# Patient Record
Sex: Female | Born: 2000 | Race: White | Hispanic: No | Marital: Single | Smoking: Never smoker
Health system: Southern US, Community
[De-identification: ages and names within clinical notes are randomized; demographics above are authoritative.]

## PROBLEM LIST (undated history)

## (undated) ENCOUNTER — Emergency Department (HOSPITAL_COMMUNITY): Admission: EM | Discharge status: Against Medical Advice | Payer: 59

## (undated) DIAGNOSIS — J45998 Other asthma: Secondary | ICD-10-CM

## (undated) DIAGNOSIS — F329 Major depressive disorder, single episode, unspecified: Secondary | ICD-10-CM

## (undated) DIAGNOSIS — J302 Other seasonal allergic rhinitis: Secondary | ICD-10-CM

## (undated) DIAGNOSIS — F32A Depression, unspecified: Secondary | ICD-10-CM

## (undated) DIAGNOSIS — E559 Vitamin D deficiency, unspecified: Secondary | ICD-10-CM

## (undated) DIAGNOSIS — R630 Anorexia: Secondary | ICD-10-CM

## (undated) HISTORY — DX: Vitamin D deficiency, unspecified: E55.9

## (undated) HISTORY — DX: Other asthma: J45.998

---
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
## 2021-02-04 ENCOUNTER — Encounter: Payer: Self-pay | Admitting: Primary Care

## 2021-04-17 ENCOUNTER — Ambulatory Visit: Payer: 59 | Admitting: Family Medicine

## 2021-04-17 ENCOUNTER — Other Ambulatory Visit: Payer: Self-pay

## 2021-04-17 ENCOUNTER — Encounter: Payer: Self-pay | Admitting: Family Medicine

## 2021-04-17 VITALS — BP 102/76 | HR 72 | Temp 98.8°F | Wt 88.8 lb

## 2021-04-17 DIAGNOSIS — N912 Amenorrhea, unspecified: Secondary | ICD-10-CM | POA: Diagnosis not present

## 2021-04-17 DIAGNOSIS — R222 Localized swelling, mass and lump, trunk: Secondary | ICD-10-CM | POA: Diagnosis not present

## 2021-04-17 NOTE — Progress Notes (Signed)
Subjective:    Patient ID: Amy Lynn, female    DOB: 11-26-2000, 20 y.o.   MRN: [id number]  Chief Complaint  Patient presents with   Mass    Noticed mass on chest near right breast 1 week ago    HPI Patient was seen today for acute concern.  Pt noticed a lump in R chest inferior to R breast last wk.  The area is painless.  Pt never noticed it prior to last wk, but concerned it may have become larger.  Pt denies breast pain, skin changes.  LMP 1 yr ago.  Past Medical History:  Diagnosis Date   Anorexia    Anorexia    Depression    Seasonal allergies    Seasonal asthma    Vitamin D insufficiency 08/28/2013   Normal level 12/22/13     No Known Allergies  ROS General: Denies fever, chills, night sweats, changes in weight, changes in appetite HEENT: Denies headaches, ear pain, changes in vision, rhinorrhea, sore throat CV: Denies CP, palpitations, SOB, orthopnea Pulm: Denies SOB, cough, wheezing GI: Denies abdominal pain, nausea, vomiting, diarrhea, constipation GU: Denies dysuria, hematuria, frequency, vaginal discharge Msk: Denies muscle cramps, joint pains  +mass in R lower chest Neuro: Denies weakness, numbness, tingling Skin: Denies rashes, bruising Psych: Denies depression, anxiety, hallucinations      Objective:    Blood pressure 102/76, pulse 72, temperature 98.8 F (37.1 C), temperature source Oral, weight 88 lb 12.8 oz (40.3 kg), SpO2 99 %.  Gen. Pleasant, well-nourished, in no distress, normal affect   HEENT: [place]/AT, face symmetric, conjunctiva clear, no scleral icterus, PERRLA, EOMI, nares patent without drainage Lungs: no accessory muscle use Cardiovascular: RRR, no peripheral edema Chest wall: mobile, soft, 3.5 cm  x 2 cm mass with firma nodule in medial edge, inferior to R lateral breast. No peau d' orange or erythema Musculoskeletal: No deformities, no cyanosis or clubbing, normal tone Neuro:  A&Ox3, CN II-XII intact, normal gait Skin:  Warm, no lesions/  rash   Wt Readings from Last 3 Encounters:  04/17/21 88 lb 12.8 oz (40.3 kg) (<1 %, Z= -2.94)*  05/30/20 (!) 91 lb (41.3 kg) (<1 %, Z= -2.55)*  10/26/19 92 lb 4 oz (41.8 kg) (1 %, Z= -2.26)*   * Growth percentiles are based on CDC (Girls, 2-20 Years) data.    Lab Results  Component Value Date   WBC 6.2 10/05/2019   HGB 12.3 10/05/2019   HCT 36.8 10/05/2019   PLT 195.0 10/05/2019   GLUCOSE 82 10/05/2019   CHOL 176 (H) 04/03/2015   TRIG 56 04/03/2015   HDL 61 04/03/2015   LDLCALC 104 04/03/2015   ALT 23 03/28/2019   AST 23 03/28/2019   NA 140 10/05/2019   K 4.1 10/05/2019   CL 102 10/05/2019   CREATININE 0.51 10/05/2019   BUN 18 10/05/2019   CO2 30 10/05/2019   TSH 1.20 10/05/2019   HGBA1C 5.7 (H) 04/03/2015    Assessment/Plan:  Mass in chest  -possible lipoma - Plan: [place] CHEST SOFT TISSUE  Absent menses -likely 2/2 h/o anorexia -3 lbs wt loss since visit 05/30/20 -consider counseling  -close f/u advised  F/u prn with pcp  Grier Mitts, MD

## 2021-04-17 NOTE — Patient Instructions (Signed)
   With ultrasound and was placed to further evaluate the area in your chest/side.  You will be called about scheduling this appointment.  I have provided some information on lipomas for you to look over.

## 2021-04-23 NOTE — Telephone Encounter (Signed)
Amy Lynn,  See patient's message, she would like to have her ultrasound done at [facility] rather than [facility] imaging, but she wants it done this week.  Is there any chance to get her in [facility] this week for the breast ultrasound?  If not then she will stick with [facility] imaging as scheduled.

## 2021-04-25 ENCOUNTER — Ambulatory Visit
Admission: RE | Admit: 2021-04-25 | Discharge: 2021-04-25 | Disposition: A | Discharge status: Home / Self Care | Payer: 59 | Source: Ambulatory Visit | Attending: Family Medicine | Admitting: Family Medicine

## 2021-04-25 ENCOUNTER — Other Ambulatory Visit: Payer: Self-pay

## 2021-04-25 DIAGNOSIS — R222 Localized swelling, mass and lump, trunk: Secondary | ICD-10-CM

## 2021-04-25 DIAGNOSIS — N6310 Unspecified lump in the right breast, unspecified quadrant: Secondary | ICD-10-CM | POA: Diagnosis not present

## 2021-05-20 ENCOUNTER — Other Ambulatory Visit: Payer: Self-pay

## 2021-05-20 DIAGNOSIS — L218 Other seborrheic dermatitis: Secondary | ICD-10-CM | POA: Diagnosis not present

## 2021-05-20 DIAGNOSIS — L280 Lichen simplex chronicus: Secondary | ICD-10-CM | POA: Diagnosis not present

## 2021-05-20 DIAGNOSIS — D485 Neoplasm of uncertain behavior of skin: Secondary | ICD-10-CM | POA: Diagnosis not present

## 2021-05-20 MED ORDER — TRIAMCINOLONE ACETONIDE 0.1 % EX CREA
TOPICAL_CREAM | CUTANEOUS | 1 refills | Status: DC
  Filled 2021-05-20: qty 15, 10d supply, fill #0
  Filled 2021-06-11: qty 15, 10d supply, fill #1

## 2021-06-11 ENCOUNTER — Other Ambulatory Visit: Payer: Self-pay

## 2021-06-12 ENCOUNTER — Other Ambulatory Visit: Payer: Self-pay

## 2021-06-12 MED ORDER — TRETINOIN 0.025 % EX CREA
TOPICAL_CREAM | CUTANEOUS | 2 refills | Status: AC
  Filled 2021-06-12: qty 20, 30d supply, fill #0
  Filled 2021-10-14: qty 20, 30d supply, fill #1
  Filled 2022-03-25: qty 20, 30d supply, fill #2

## 2021-09-12 ENCOUNTER — Other Ambulatory Visit: Payer: Self-pay

## 2021-09-12 MED ORDER — AMOXICILLIN 500 MG PO CAPS
ORAL_CAPSULE | ORAL | 0 refills | Status: DC
  Filled 2021-09-12: qty 21, 7d supply, fill #0

## 2021-10-14 ENCOUNTER — Other Ambulatory Visit: Payer: Self-pay

## 2022-03-25 ENCOUNTER — Other Ambulatory Visit: Payer: Self-pay

## 2022-04-09 DIAGNOSIS — L728 Other follicular cysts of the skin and subcutaneous tissue: Secondary | ICD-10-CM | POA: Diagnosis not present

## 2022-04-09 DIAGNOSIS — L700 Acne vulgaris: Secondary | ICD-10-CM | POA: Diagnosis not present

## 2022-04-09 DIAGNOSIS — D2361 Other benign neoplasm of skin of right upper limb, including shoulder: Secondary | ICD-10-CM | POA: Diagnosis not present

## 2022-04-23 DIAGNOSIS — L538 Other specified erythematous conditions: Secondary | ICD-10-CM | POA: Diagnosis not present

## 2022-04-23 DIAGNOSIS — L728 Other follicular cysts of the skin and subcutaneous tissue: Secondary | ICD-10-CM | POA: Diagnosis not present

## 2022-04-23 DIAGNOSIS — D2272 Melanocytic nevi of left lower limb, including hip: Secondary | ICD-10-CM | POA: Diagnosis not present

## 2022-04-23 DIAGNOSIS — D2361 Other benign neoplasm of skin of right upper limb, including shoulder: Secondary | ICD-10-CM | POA: Diagnosis not present

## 2022-04-23 DIAGNOSIS — D485 Neoplasm of uncertain behavior of skin: Secondary | ICD-10-CM | POA: Diagnosis not present

## 2022-09-11 ENCOUNTER — Ambulatory Visit: Payer: 59 | Admitting: Primary Care

## 2022-09-25 ENCOUNTER — Ambulatory Visit (INDEPENDENT_AMBULATORY_CARE_PROVIDER_SITE_OTHER): Payer: 59 | Admitting: Primary Care

## 2022-09-25 ENCOUNTER — Encounter: Payer: Self-pay | Admitting: Primary Care

## 2022-09-25 VITALS — BP 108/66 | HR 88 | Temp 97.5°F | Ht 60.0 in | Wt 90.4 lb

## 2022-09-25 DIAGNOSIS — F5000 Anorexia nervosa, unspecified: Secondary | ICD-10-CM

## 2022-09-25 DIAGNOSIS — L818 Other specified disorders of pigmentation: Secondary | ICD-10-CM | POA: Insufficient documentation

## 2022-09-25 LAB — COMPREHENSIVE METABOLIC PANEL
ALT: 50 U/L — ABNORMAL HIGH (ref 0–35)
AST: 41 U/L — ABNORMAL HIGH (ref 0–37)
Albumin: 4.9 g/dL (ref 3.5–5.2)
Alkaline Phosphatase: 74 U/L (ref 39–117)
BUN: 13 mg/dL (ref 6–23)
CO2: 32 mEq/L (ref 19–32)
Calcium: 9.1 mg/dL (ref 8.4–10.5)
Chloride: 101 mEq/L (ref 96–112)
Creatinine, Ser: 0.58 mg/dL (ref 0.40–1.20)
GFR: 130.59 mL/min (ref >60.00)
Glucose, Bld: 76 mg/dL (ref 70–99)
Potassium: 3.9 mEq/L (ref 3.5–5.1)
Sodium: 141 mEq/L (ref 135–145)
Total Bilirubin: 0.3 mg/dL (ref 0.2–1.2)
Total Protein: 7.4 g/dL (ref 6.0–8.3)

## 2022-09-25 LAB — CBC
HCT: 41.5 % (ref 36.0–46.0)
Hemoglobin: 14 g/dL (ref 12.0–15.0)
MCHC: 33.6 g/dL (ref 30.0–36.0)
MCV: 90.8 fl (ref 78.0–100.0)
Platelets: 199 10*3/uL (ref 150.0–400.0)
RBC: 4.57 Mil/uL (ref 3.87–5.11)
RDW: 13.2 % (ref 11.5–14.6)
WBC: 4.9 10*3/uL (ref 4.5–10.5)

## 2022-09-25 LAB — PHOSPHORUS: Phosphorus: 3.1 mg/dL (ref 2.3–4.6)

## 2022-09-25 LAB — VITAMIN B12: Vitamin B-12: 505 pg/mL (ref 211–911)

## 2022-09-25 LAB — MAGNESIUM: Magnesium: 2 mg/dL (ref 1.5–2.5)

## 2022-09-25 LAB — VITAMIN D 25 HYDROXY (VIT D DEFICIENCY, FRACTURES): VITD: 39.19 ng/mL (ref 30.00–100.00)

## 2022-09-25 LAB — TSH: TSH: 2.37 u[IU]/mL (ref 0.35–5.50)

## 2022-09-25 NOTE — Patient Instructions (Signed)
Stop by the lab prior to leaving today. I will notify you of your results once received.   It was a pleasure to see you today!  

## 2022-09-25 NOTE — Assessment & Plan Note (Signed)
No apparent complications. Checking HIV and Hep C labs today.

## 2022-09-25 NOTE — Assessment & Plan Note (Signed)
In remission.  Commended her on her balanced diet. Long discussion today regarding goals of overall health and bone health.  Continue calcium consumption, multivitamin, and weight bearing exercise.   Checking labs today including TSH, CMP, CBC, phosphorous, magnesium, vitamin levels.

## 2022-09-25 NOTE — Progress Notes (Signed)
Subjective:    Patient ID: Amy Lynn, female    DOB: 03/12/2001, 21 y.o.   MRN: [id number]  HPI  Amy Lynn is a very pleasant 21 y.o. female with a history of anorexia nervosa, hyponatremia, obsessive behavior who presents today to discuss a few concerns.  History of anorexia nervosa, has been in remission for 2 years. She is concerned about her overall nutrition given her anorexia history, particularly her bone mass. She feels stable at her current weight, has not lost or gained weight.  She counts calories daily, is eating 1600-1700 calories daily which is an increase from prior years. She takes a multivitamin once daily. She tries to drink 3 cups of almond milk daily, also eats cottage cheese and/or yogurt. She incorporates a balance of fruits, vegetables, nuts, and protein. She is exercising at the gym 15 min 7 days weekly, cardio only.   She does not have regular menstrual cycles, typically experiences menses once annually. Her mother was the same, history of amenorrhea. She is not concerned about this today.  Wt Readings from Last 3 Encounters:  09/25/22 90 lb 6.4 oz (41 kg)  04/17/21 88 lb 12.8 oz (40.3 kg) (<1 %, Z= -2.94)*  05/30/20 (!) 91 lb (41.3 kg) (<1 %, Z= -2.55)*   * Growth percentiles are based on CDC (Girls, 2-20 Years) data.   She used [employer] for a poke and stick tattoo at the age of 48, has three tattoos in total. Is concerned about health safety given the use of the tattoo needle without a sterile field. She denies any infections to the site at the time.    Review of Systems  Respiratory:  Negative for shortness of breath.   Cardiovascular:  Negative for chest pain and palpitations.  Gastrointestinal:  Negative for constipation and diarrhea.  Genitourinary:  Positive for menstrual problem.       Amenorrhea.   Neurological:  Negative for dizziness.         Past Medical History:  Diagnosis Date   Anorexia    Anorexia    Arthralgia of right hand  03/28/2019   Depression    Seasonal allergies    Seasonal asthma    Vitamin D insufficiency 08/28/2013   Normal level 12/22/13     Social History   Socioeconomic History   Marital status: Single    Spouse name: Not on file   Number of children: Not on file   Years of education: Not on file   Highest education level: Not on file  Occupational History   Not on file  Tobacco Use   Smoking status: Never   Smokeless tobacco: Never  Substance and Sexual Activity   Alcohol use: No    Alcohol/week: 0.0 standard drinks of alcohol   Drug use: No   Sexual activity: Never    Birth control/protection: Abstinence  Other Topics Concern   Not on file  Social History Narrative   Birth history:  Born at [facility], full term, c-section, no complications.     Lives at home with mother and father, no siblings.   Enjoys baking, reading, creating art.   Social Determinants of Health   Financial Resource Strain: Not on file  Food Insecurity: Not on file  Transportation Needs: Not on file  Physical Activity: Not on file  Stress: Not on file  Social Connections: Not on file  Intimate Partner Violence: Not on file    No past surgical history on file.  Family History  Problem Relation Age of Onset   Liver disease Neg Hx     Allergies  Allergen Reactions   Latex Rash    Current Outpatient Medications on File Prior to Visit  Medication Sig Dispense Refill   Multiple Vitamin (MULTIVITAMIN) tablet Take 1 tablet by mouth daily.     No current facility-administered medications on file prior to visit.    BP 108/66   Pulse 88   Temp (!) 97.5 F (36.4 C) (Oral)   Ht 5' (1.524 m)   Wt 90 lb 6.4 oz (41 kg)   SpO2 99%   BMI 17.66 kg/m  Objective:   Physical Exam Cardiovascular:     Rate and Rhythm: Normal rate and regular rhythm.  Pulmonary:     Effort: Pulmonary effort is normal.     Breath sounds: Normal breath sounds.  Abdominal:     General: Bowel sounds  are normal.     Palpations: Abdomen is soft.     Tenderness: There is no abdominal tenderness.  Musculoskeletal:     Cervical back: Neck supple.  Skin:    General: Skin is warm and dry.           Assessment & Plan:   Problem List Items Addressed This Visit       Other   Anorexia nervosa - Primary    In remission.  Commended her on her balanced diet. Long discussion today regarding goals of overall health and bone health.  Continue calcium consumption, multivitamin, and weight bearing exercise.   Checking labs today including TSH, CMP, CBC, phosphorous, magnesium, vitamin levels.      Relevant Orders   CBC   Comprehensive metabolic panel   VITAMIN D 25 Hydroxy (Vit-D Deficiency, Fractures)   Hepatitis C antibody   HIV Antibody (routine testing w rflx)   Phosphorus   Magnesium   TSH   Vitamin B12   History of tattoo    No apparent complications. Checking HIV and Hep C labs today.          Pleas Koch, NP

## 2022-09-26 LAB — HIV ANTIBODY (ROUTINE TESTING W REFLEX): HIV 1&2 Ab, 4th Generation: NONREACTIVE

## 2022-09-26 LAB — HEPATITIS C ANTIBODY: Hepatitis C Ab: NONREACTIVE

## 2023-03-29 IMAGING — US US BREAST*R* LIMITED INC AXILLA
1 series · 2 of 2 positions shown · non-contrast
Comparison: None.

CLINICAL DATA: 19-year-old female with a palpable right breast
lump.

EXAM:
ULTRASOUND OF THE RIGHT BREAST

[Series 1: us breast*right* limited inc axilla · 0.05mm/px · 2 of 2 slices shown]
[im 1/2]
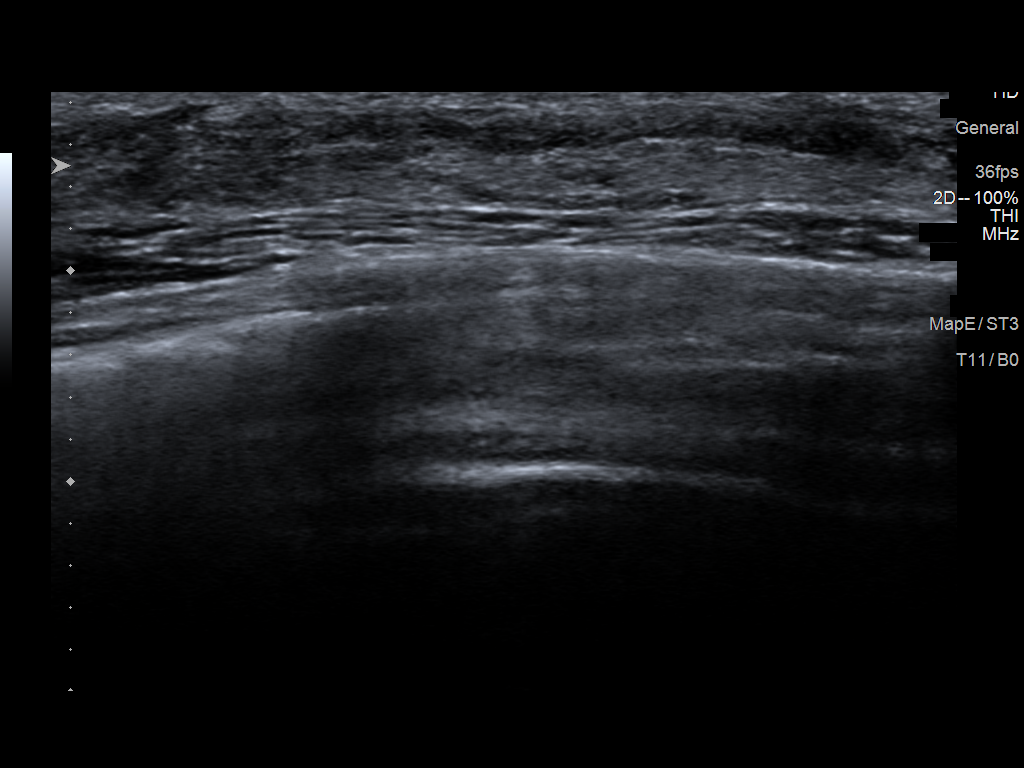
[im 2/2]
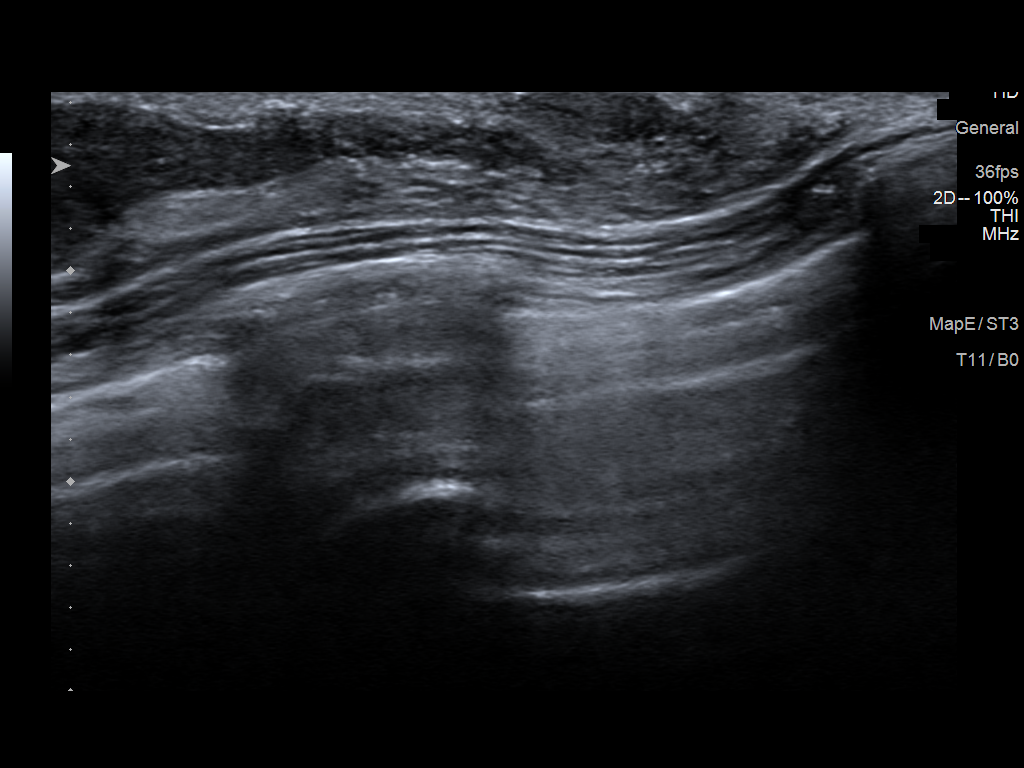

[2 of 2 positions shown; findings below may reference images not displayed]

FINDINGS: On physical exam, I palpate a soft, mobile area of thickening along
the inferior right breast.

Targeted ultrasound is performed, showing heterogenous
fibroglandular tissue without focal or suspicious sonographic
abnormality. Evaluation of the inferior right breast was performed.
IMPRESSION: Unremarkable ultrasound evaluation of the right breast.

RECOMMENDATION:
1. Clinical follow-up recommended for the palpable area of concern
in the breast. Any further workup should be based on clinical
grounds.
2. Screening mammogram at age 41 unless there are persistent or
intervening clinical concerns. (Code:[id number]-K-BES)

I have discussed the findings and recommendations with the patient.
If applicable, a reminder letter will be sent to the patient
regarding the next appointment.

BI-RADS CATEGORY  1: Negative.

## 2023-10-06 ENCOUNTER — Other Ambulatory Visit: Payer: Self-pay

## 2023-10-06 DIAGNOSIS — L700 Acne vulgaris: Secondary | ICD-10-CM | POA: Diagnosis not present

## 2023-10-06 MED ORDER — TRETINOIN 0.025 % EX GEL
1.0000 | Freq: Every evening | CUTANEOUS | 2 refills | Status: AC
  Filled 2023-10-06: qty 45, 30d supply, fill #0

## 2023-10-06 MED ORDER — DOXYCYCLINE MONOHYDRATE 50 MG PO TABS
50.0000 mg | ORAL_TABLET | Freq: Two times a day (BID) | ORAL | 3 refills | Status: AC
  Filled 2023-10-06: qty 60, 30d supply, fill #0
  Filled 2023-11-04: qty 60, 30d supply, fill #1

## 2023-10-07 ENCOUNTER — Other Ambulatory Visit: Payer: Self-pay

## 2023-10-29 DIAGNOSIS — L700 Acne vulgaris: Secondary | ICD-10-CM | POA: Diagnosis not present

## 2023-10-29 DIAGNOSIS — D2262 Melanocytic nevi of left upper limb, including shoulder: Secondary | ICD-10-CM | POA: Diagnosis not present

## 2023-11-04 ENCOUNTER — Other Ambulatory Visit: Payer: Self-pay

## 2023-12-02 ENCOUNTER — Other Ambulatory Visit: Payer: Self-pay

## 2023-12-02 ENCOUNTER — Encounter: Payer: Self-pay | Admitting: Pharmacist

## 2023-12-02 DIAGNOSIS — L308 Other specified dermatitis: Secondary | ICD-10-CM | POA: Diagnosis not present

## 2023-12-02 DIAGNOSIS — L700 Acne vulgaris: Secondary | ICD-10-CM | POA: Diagnosis not present

## 2023-12-02 MED ORDER — ISOTRETINOIN 30 MG PO CAPS
30.0000 mg | ORAL_CAPSULE | Freq: Every day | ORAL | 0 refills | Status: DC
  Filled 2023-12-02 – 2023-12-03 (×3): qty 30, 30d supply, fill #0

## 2023-12-02 MED ORDER — TRIAMCINOLONE ACETONIDE 0.1 % EX OINT
TOPICAL_OINTMENT | CUTANEOUS | 1 refills | Status: AC
  Filled 2023-12-02: qty 80, 30d supply, fill #0

## 2023-12-03 ENCOUNTER — Other Ambulatory Visit: Payer: Self-pay

## 2023-12-30 ENCOUNTER — Other Ambulatory Visit: Payer: Self-pay

## 2023-12-30 DIAGNOSIS — L700 Acne vulgaris: Secondary | ICD-10-CM | POA: Diagnosis not present

## 2023-12-30 DIAGNOSIS — Z3202 Encounter for pregnancy test, result negative: Secondary | ICD-10-CM | POA: Diagnosis not present

## 2023-12-30 MED ORDER — ISOTRETINOIN 30 MG PO CAPS
60.0000 mg | ORAL_CAPSULE | Freq: Every day | ORAL | 0 refills | Status: DC
  Filled 2023-12-30 (×2): qty 60, 30d supply, fill #0

## 2024-01-25 DIAGNOSIS — L700 Acne vulgaris: Secondary | ICD-10-CM | POA: Diagnosis not present

## 2024-02-02 ENCOUNTER — Other Ambulatory Visit: Payer: Self-pay

## 2024-02-02 DIAGNOSIS — Z3202 Encounter for pregnancy test, result negative: Secondary | ICD-10-CM | POA: Diagnosis not present

## 2024-02-02 DIAGNOSIS — L700 Acne vulgaris: Secondary | ICD-10-CM | POA: Diagnosis not present

## 2024-02-02 DIAGNOSIS — R040 Epistaxis: Secondary | ICD-10-CM | POA: Diagnosis not present

## 2024-02-02 MED ORDER — ISOTRETINOIN 30 MG PO CAPS
60.0000 mg | ORAL_CAPSULE | Freq: Every day | ORAL | 0 refills | Status: AC
  Filled 2024-02-02 (×2): qty 60, 30d supply, fill #0

## 2024-03-02 ENCOUNTER — Other Ambulatory Visit: Payer: Self-pay

## 2024-03-02 DIAGNOSIS — L700 Acne vulgaris: Secondary | ICD-10-CM | POA: Diagnosis not present

## 2024-03-02 DIAGNOSIS — Z3202 Encounter for pregnancy test, result negative: Secondary | ICD-10-CM | POA: Diagnosis not present

## 2024-03-03 ENCOUNTER — Other Ambulatory Visit: Payer: Self-pay

## 2024-03-03 ENCOUNTER — Other Ambulatory Visit (HOSPITAL_COMMUNITY): Payer: Self-pay

## 2024-03-03 MED ORDER — ISOTRETINOIN 40 MG PO CAPS
80.0000 mg | ORAL_CAPSULE | Freq: Every day | ORAL | 0 refills | Status: DC
  Filled 2024-03-03: qty 60, 30d supply, fill #0

## 2024-04-05 ENCOUNTER — Other Ambulatory Visit: Payer: Self-pay

## 2024-04-05 DIAGNOSIS — L700 Acne vulgaris: Secondary | ICD-10-CM | POA: Diagnosis not present

## 2024-04-05 DIAGNOSIS — Z3202 Encounter for pregnancy test, result negative: Secondary | ICD-10-CM | POA: Diagnosis not present

## 2024-04-05 MED ORDER — ISOTRETINOIN 40 MG PO CAPS
80.0000 mg | ORAL_CAPSULE | Freq: Every day | ORAL | 0 refills | Status: DC
  Filled 2024-04-05: qty 60, 30d supply, fill #0

## 2024-05-04 ENCOUNTER — Other Ambulatory Visit: Payer: Self-pay

## 2024-05-04 DIAGNOSIS — L700 Acne vulgaris: Secondary | ICD-10-CM | POA: Diagnosis not present

## 2024-05-04 MED ORDER — ISOTRETINOIN 40 MG PO CAPS
80.0000 mg | ORAL_CAPSULE | Freq: Every day | ORAL | 0 refills | Status: DC
  Filled 2024-05-04 (×2): qty 60, 30d supply, fill #0

## 2024-06-02 ENCOUNTER — Other Ambulatory Visit: Payer: Self-pay

## 2024-06-02 DIAGNOSIS — K130 Diseases of lips: Secondary | ICD-10-CM | POA: Diagnosis not present

## 2024-06-02 DIAGNOSIS — L700 Acne vulgaris: Secondary | ICD-10-CM | POA: Diagnosis not present

## 2024-06-02 MED ORDER — MUPIROCIN 2 % EX OINT
TOPICAL_OINTMENT | Freq: Three times a day (TID) | CUTANEOUS | 0 refills | Status: AC
  Filled 2024-06-02: qty 22, 7d supply, fill #0

## 2024-06-02 MED ORDER — ISOTRETINOIN 40 MG PO CAPS
80.0000 mg | ORAL_CAPSULE | Freq: Every day | ORAL | 0 refills | Status: AC
  Filled 2024-06-02: qty 60, 30d supply, fill #0

## 2024-07-06 ENCOUNTER — Other Ambulatory Visit: Payer: Self-pay

## 2024-07-06 DIAGNOSIS — L700 Acne vulgaris: Secondary | ICD-10-CM | POA: Diagnosis not present

## 2024-07-06 MED ORDER — TRETINOIN 0.025 % EX CREA
TOPICAL_CREAM | CUTANEOUS | 3 refills | Status: AC
  Filled 2024-07-06: qty 20, 30d supply, fill #0

## 2024-07-17 ENCOUNTER — Other Ambulatory Visit: Payer: Self-pay

## 2024-07-17 ENCOUNTER — Telehealth: Payer: Self-pay

## 2024-07-17 NOTE — Telephone Encounter (Signed)
 Patient declined states just wants a nurse visit. Advised that since she has not seen PCP since 2023 needs ov.  States will call back if she decides to do be seen by provider

## 2024-07-17 NOTE — Telephone Encounter (Signed)
 Copied from CRM #[id number]. Topic: Appointments - Scheduling Inquiry for Clinic >> Jul 17, 2024  3:16 PM [place] M wrote: Reason for CRM: Patient wanting to get updated on vaccines Meningitis B- Hep B-Flu-Tdap. Please reach out [phone]

## 2024-07-17 NOTE — Telephone Encounter (Signed)
 Let's get her in for CPE at her earliest convenience. We can update vaccines then.   If she needs the vaccines before our visit (for school) then okay to schedule nurse visit Tdap and meningococcal B.

## 2024-08-03 DIAGNOSIS — D225 Melanocytic nevi of trunk: Secondary | ICD-10-CM | POA: Diagnosis not present

## 2024-08-03 DIAGNOSIS — D2261 Melanocytic nevi of right upper limb, including shoulder: Secondary | ICD-10-CM | POA: Diagnosis not present

## 2024-08-03 DIAGNOSIS — D2272 Melanocytic nevi of left lower limb, including hip: Secondary | ICD-10-CM | POA: Diagnosis not present

## 2024-08-03 DIAGNOSIS — L700 Acne vulgaris: Secondary | ICD-10-CM | POA: Diagnosis not present

## 2024-08-03 DIAGNOSIS — D2271 Melanocytic nevi of right lower limb, including hip: Secondary | ICD-10-CM | POA: Diagnosis not present

## 2024-08-03 DIAGNOSIS — D2262 Melanocytic nevi of left upper limb, including shoulder: Secondary | ICD-10-CM | POA: Diagnosis not present

## 2024-08-03 DIAGNOSIS — L308 Other specified dermatitis: Secondary | ICD-10-CM | POA: Diagnosis not present
